# Patient Record
Sex: Female | Born: 1995 | Hispanic: No | Marital: Single | Smoking: Never smoker
Health system: Southern US, Community
[De-identification: ages and names within clinical notes are randomized; demographics above are authoritative.]

---
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
## 2020-11-20 ENCOUNTER — Emergency Department (HOSPITAL_COMMUNITY)
Admission: EM | Admit: 2020-11-20 | Discharge: 2020-11-20 | Disposition: A | Discharge status: Home / Self Care | Payer: Self-pay | Attending: Emergency Medicine | Admitting: Emergency Medicine

## 2020-11-20 ENCOUNTER — Other Ambulatory Visit: Payer: Self-pay

## 2020-11-20 ENCOUNTER — Emergency Department (HOSPITAL_COMMUNITY): Payer: Self-pay

## 2020-11-20 DIAGNOSIS — Z111 Encounter for screening for respiratory tuberculosis: Secondary | ICD-10-CM | POA: Insufficient documentation

## 2020-11-20 NOTE — ED Triage Notes (Signed)
"  I tested positive on my TB test, but I got some immunization since I was [place] in [place], but I need a chest xray so I can go to school."

## 2020-11-20 NOTE — Discharge Instructions (Signed)
Your chest x-ray was unremarkable. I have also ordered a TB blood test.  Please follow-up with the Department of Public Health for further direction on your results.  I also recommend following up with a primary care provider.  As you do not have any evidence of active tuberculosis you are being discharged from the ER for follow-up on your blood work.  Given that you have had the BCG vaccination and this is very possibly a false positive TB skin test.  However it is important to follow-up on these results.

## 2020-11-20 NOTE — ED Provider Notes (Signed)
[facility] EMERGENCY DEPARTMENT Provider Note   CSN: [id number] Arrival date & time: 11/20/20  5038     History Chief Complaint  Patient presents with  . Medical Clearance    Dominique Constantin is a 25 y.o. female.  HPI  25 year old female no medical problems presenting after + TB TST w 53mm of induration.  No symptoms. Received BCG vaccine at an early age. Got asymptomatic testing for school.   Denies fever, chills, cough, congestion, SOB, CP.   Immigrated from [place] as a child.  Father states unable to obtain any paperwork showing BCG vaccination status.  He states that all of them were vaccinated and all of them have had positive TST in the past.     No past medical history on file.  There are no problems to display for this patient.   No past surgical history on file.   OB History   No obstetric history on file.     No family history on file.  Social History   Tobacco Use  . Smoking status: Never Smoker  Substance Use Topics  . Alcohol use: No  . Drug use: No    Home Medications Prior to Admission medications   Not on File    Allergies    Patient has no known allergies.  Review of Systems   Review of Systems  Constitutional: Negative for fever.  HENT: Negative for congestion.   Respiratory: Negative for shortness of breath.   Cardiovascular: Negative for chest pain.  Gastrointestinal: Negative for abdominal distention.  Neurological: Negative for dizziness and headaches.    Physical Exam Updated Vital Signs BP 122/79   Pulse 94   Temp 98.7 F (37.1 C) (Oral)   Resp 16   Ht 5' (1.524 m)   Wt 59 kg   SpO2 96%   BMI 25.39 kg/m   Physical Exam Vitals and nursing note reviewed.  Constitutional:      General: She is not in acute distress. HENT:     Head: Normocephalic and atraumatic.     Nose: Nose normal.  Eyes:     General: No scleral icterus. Cardiovascular:     Rate and Rhythm: Normal rate and regular  rhythm.     Pulses: Normal pulses.     Heart sounds: Normal heart sounds.     Comments: HR 94 Pulmonary:     Effort: Pulmonary effort is normal. No respiratory distress.     Breath sounds: Normal breath sounds. No wheezing.  Abdominal:     Palpations: Abdomen is soft.     Tenderness: There is no abdominal tenderness.  Musculoskeletal:     Cervical back: Normal range of motion.     Right lower leg: No edema.     Left lower leg: No edema.  Skin:    General: Skin is warm and dry.     Capillary Refill: Capillary refill takes less than 2 seconds.  Neurological:     Mental Status: She is alert. Mental status is at baseline.  Psychiatric:        Mood and Affect: Mood normal.        Behavior: Behavior normal.     ED Results / Procedures / Treatments   Labs (all labs ordered are listed, but only abnormal results are displayed) Labs Reviewed  QUANTIFERON-TB GOLD PLUS    EKG None  Radiology DG Chest 2 View  Result Date: 11/20/2020 CLINICAL DATA:  Positive TB test EXAM: CHEST -  2 VIEW COMPARISON:  None. FINDINGS: The heart size and mediastinal contours are within normal limits. Both lungs are clear. The visualized skeletal structures are unremarkable. IMPRESSION: No active cardiopulmonary disease. Specifically, no evidence of active TB infection. Electronically Signed   By: Duanne Guess D.O.   On: 11/20/2020 08:24    Procedures Procedures   Medications Ordered in ED Medications - No data to display  ED Course  I have reviewed the triage vital signs and the nursing notes.  Pertinent labs & imaging results that were available during my care of the patient were reviewed by me and considered in my medical decision making (see chart for details).    MDM Rules/Calculators/A&P                          Patient is a 25 year old female immigrated as a child from [place].  Has received BCG vaccination.  She is here today after positive TB skin test  She is  asymptomatic.  Well-appearing has no complaints today.  I suspect this is a false positive test given her BCG vaccination status.  Chest x-ray without abnormality.  Quantiferon gold ordered.  Patient instructed to download MyChart so that she can receive results.  Lab work was sent to laboratory by [employer] consultant.  Patient initially hypertensive and very anxious about blood draw with mild tachycardia this resolved after blood draw.  Patient is calm and relaxed at this time.  He is agreeable to discharge at this time with follow-up with the health department and PCP.  Final Clinical Impression(s) / ED Diagnoses Final diagnoses:  Screening-pulmonary TB    Rx / DC Orders ED Discharge Orders    None       Gailen Shelter, [place] 11/20/20 0900    Vanetta Mulders, MD 11/21/20 [phone]

## 2021-08-20 DIAGNOSIS — R002 Palpitations: Secondary | ICD-10-CM | POA: Diagnosis not present

## 2022-02-17 DIAGNOSIS — Z124 Encounter for screening for malignant neoplasm of cervix: Secondary | ICD-10-CM | POA: Diagnosis not present

## 2022-02-17 DIAGNOSIS — R69 Illness, unspecified: Secondary | ICD-10-CM | POA: Diagnosis not present

## 2022-02-17 DIAGNOSIS — Z0000 Encounter for general adult medical examination without abnormal findings: Secondary | ICD-10-CM | POA: Diagnosis not present

## 2022-10-31 IMAGING — DX DG CHEST 2V
2 series · 2 of 2 positions shown · non-contrast
Comparison: None.

CLINICAL DATA: Positive TB test

EXAM:
CHEST - 2 VIEW

[w chest pa]
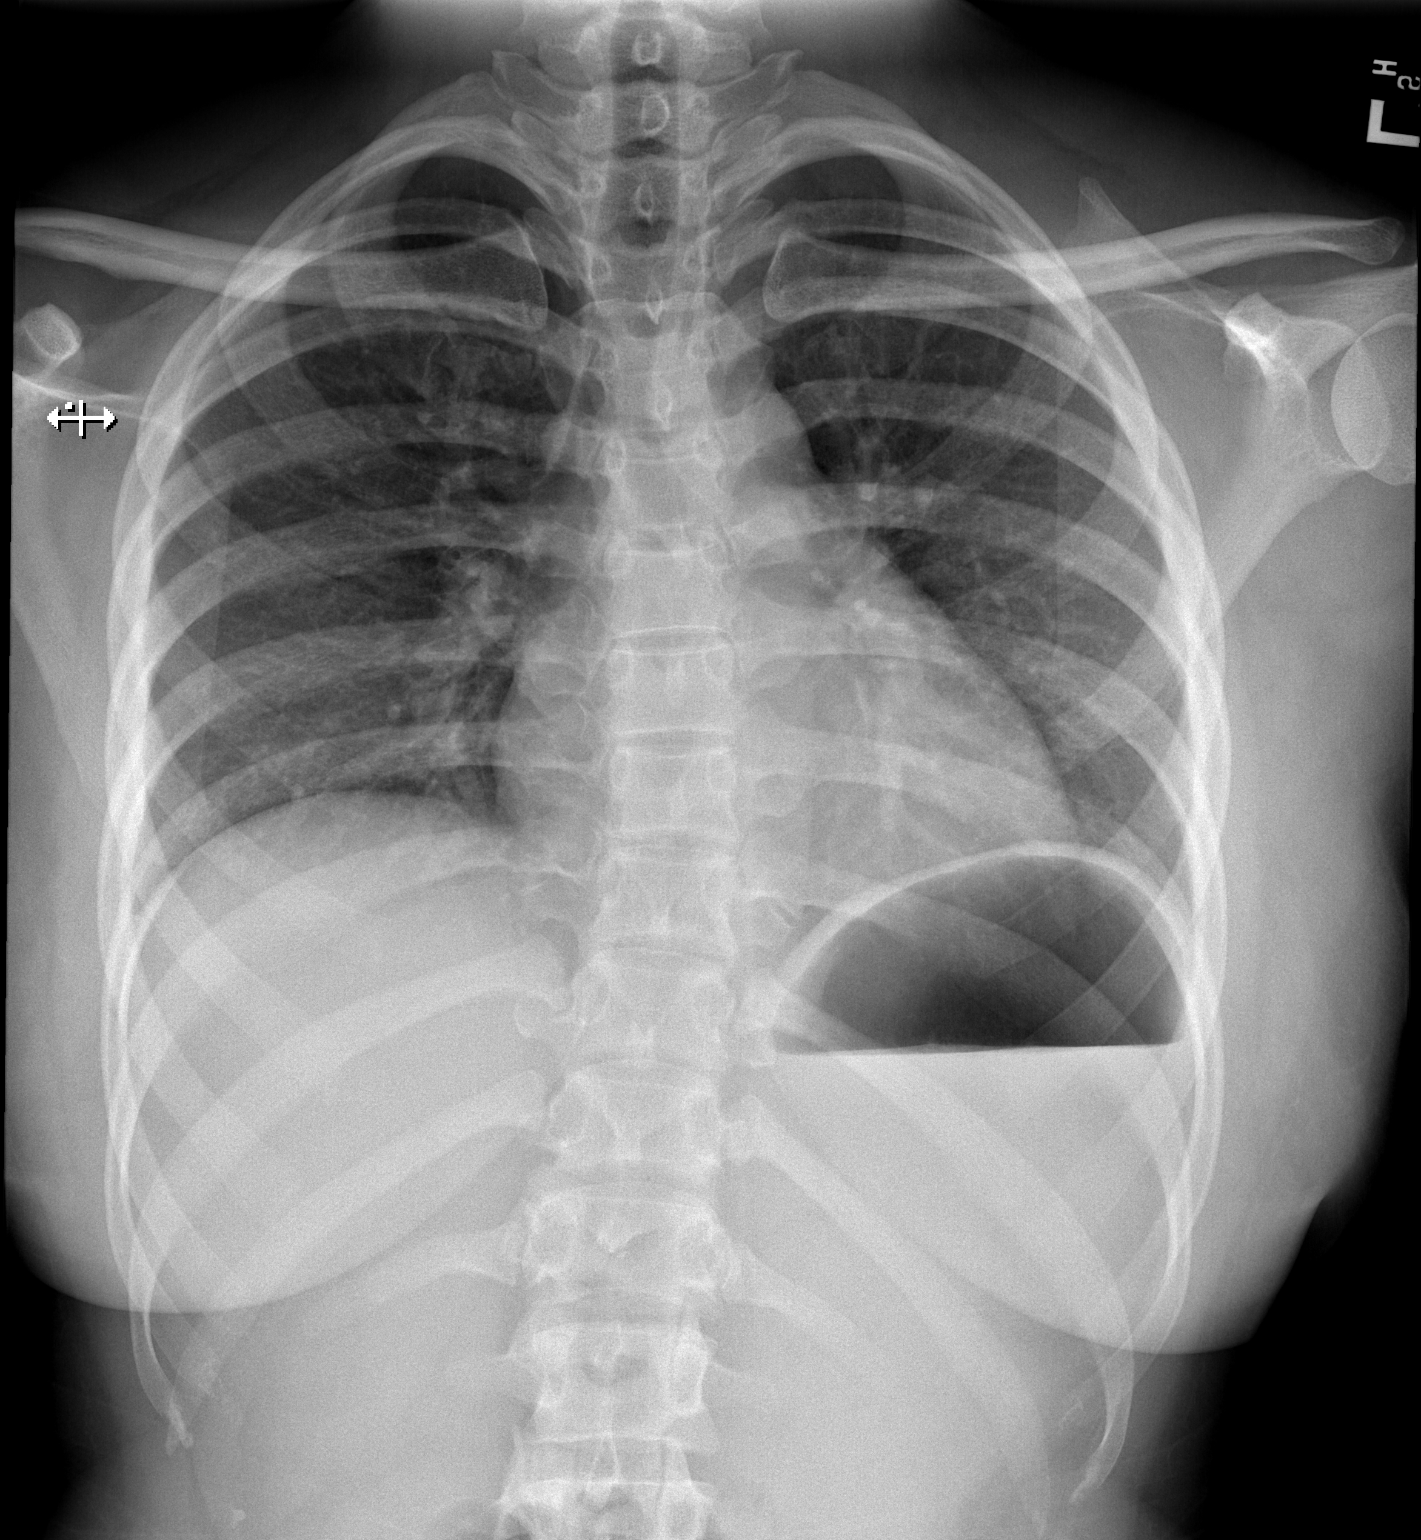

[w chest lat]
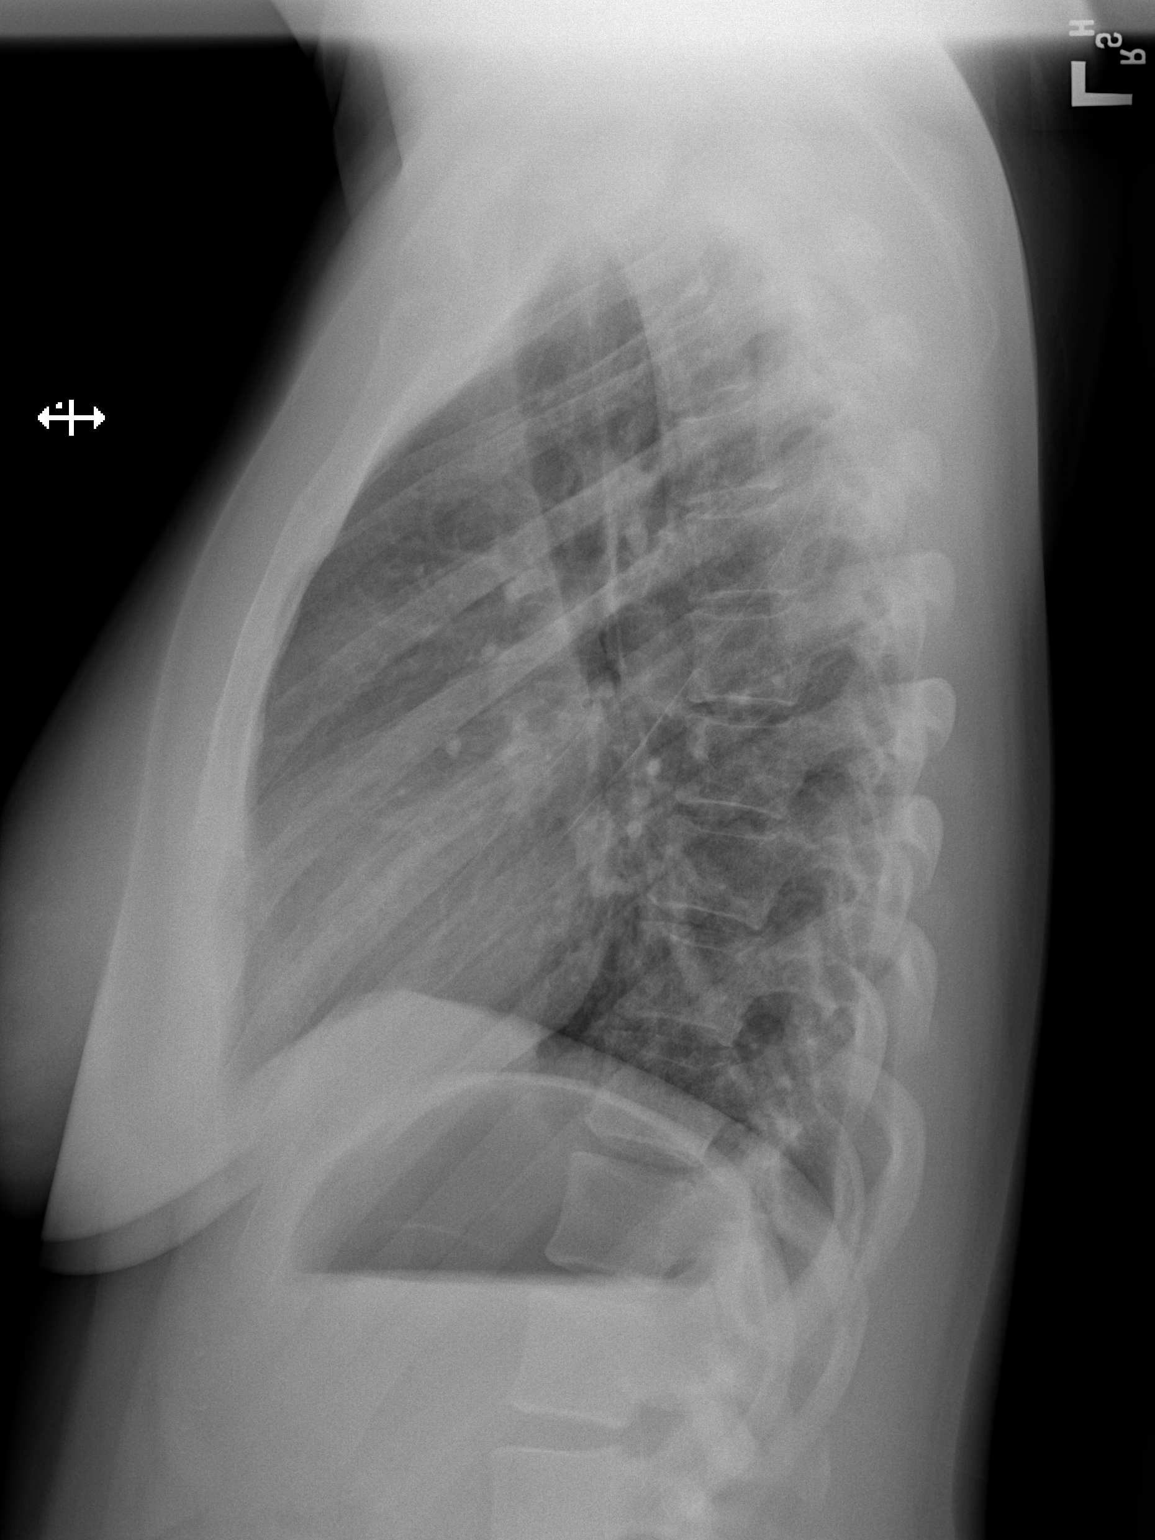

[2 of 2 positions shown; findings below may reference images not displayed]

FINDINGS: The heart size and mediastinal contours are within normal limits.
Both lungs are clear. The visualized skeletal structures are
unremarkable.
IMPRESSION: No active cardiopulmonary disease. Specifically, no evidence of
active TB infection.

## 2023-04-19 DIAGNOSIS — R1032 Left lower quadrant pain: Secondary | ICD-10-CM | POA: Diagnosis not present

## 2023-05-09 DIAGNOSIS — K219 Gastro-esophageal reflux disease without esophagitis: Secondary | ICD-10-CM | POA: Diagnosis not present

## 2023-05-09 DIAGNOSIS — K209 Esophagitis, unspecified without bleeding: Secondary | ICD-10-CM | POA: Diagnosis not present

## 2023-05-26 DIAGNOSIS — Z0000 Encounter for general adult medical examination without abnormal findings: Secondary | ICD-10-CM | POA: Diagnosis not present
